# Patient Record
Sex: Female | Born: 1990 | Race: Black or African American | Hispanic: No | Marital: Married | Smoking: Never smoker
Health system: Southern US, Community
[De-identification: ages and names within clinical notes are randomized; demographics above are authoritative.]

## PROBLEM LIST (undated history)

## (undated) DIAGNOSIS — Z789 Other specified health status: Secondary | ICD-10-CM

---

## 2014-10-09 ENCOUNTER — Other Ambulatory Visit (HOSPITAL_COMMUNITY): Payer: Self-pay | Admitting: Obstetrics

## 2014-10-09 DIAGNOSIS — O30003 Twin pregnancy, unspecified number of placenta and unspecified number of amniotic sacs, third trimester: Secondary | ICD-10-CM

## 2014-10-09 DIAGNOSIS — Z0489 Encounter for examination and observation for other specified reasons: Secondary | ICD-10-CM

## 2014-10-09 DIAGNOSIS — IMO0002 Reserved for concepts with insufficient information to code with codable children: Secondary | ICD-10-CM

## 2014-10-23 ENCOUNTER — Encounter (HOSPITAL_COMMUNITY): Payer: Self-pay

## 2014-10-23 ENCOUNTER — Ambulatory Visit (HOSPITAL_COMMUNITY)
Admission: RE | Admit: 2014-10-23 | Discharge: 2014-10-23 | Disposition: A | Discharge status: Home / Self Care | Payer: Medicaid Other | Source: Ambulatory Visit | Attending: Obstetrics | Admitting: Obstetrics

## 2014-10-23 ENCOUNTER — Other Ambulatory Visit (HOSPITAL_COMMUNITY): Payer: Self-pay | Admitting: Obstetrics

## 2014-10-23 DIAGNOSIS — O30003 Twin pregnancy, unspecified number of placenta and unspecified number of amniotic sacs, third trimester: Secondary | ICD-10-CM | POA: Diagnosis not present

## 2014-10-23 DIAGNOSIS — Z0489 Encounter for examination and observation for other specified reasons: Secondary | ICD-10-CM

## 2014-10-23 DIAGNOSIS — IMO0002 Reserved for concepts with insufficient information to code with codable children: Secondary | ICD-10-CM

## 2014-10-23 DIAGNOSIS — Z3689 Encounter for other specified antenatal screening: Secondary | ICD-10-CM | POA: Insufficient documentation

## 2014-10-23 DIAGNOSIS — O30043 Twin pregnancy, dichorionic/diamniotic, third trimester: Secondary | ICD-10-CM | POA: Insufficient documentation

## 2014-10-23 DIAGNOSIS — Z3A28 28 weeks gestation of pregnancy: Secondary | ICD-10-CM | POA: Insufficient documentation

## 2014-11-20 ENCOUNTER — Ambulatory Visit (HOSPITAL_COMMUNITY)
Admission: RE | Admit: 2014-11-20 | Discharge: 2014-11-20 | Disposition: A | Discharge status: Home / Self Care | Payer: Medicaid Other | Source: Ambulatory Visit | Attending: Obstetrics | Admitting: Obstetrics

## 2014-11-20 ENCOUNTER — Other Ambulatory Visit (HOSPITAL_COMMUNITY): Payer: Self-pay | Admitting: Obstetrics

## 2014-11-20 ENCOUNTER — Encounter (HOSPITAL_COMMUNITY): Payer: Self-pay

## 2014-11-20 DIAGNOSIS — Z36 Encounter for antenatal screening of mother: Secondary | ICD-10-CM | POA: Diagnosis not present

## 2014-11-20 DIAGNOSIS — O30003 Twin pregnancy, unspecified number of placenta and unspecified number of amniotic sacs, third trimester: Secondary | ICD-10-CM | POA: Diagnosis not present

## 2014-11-20 DIAGNOSIS — Z3A32 32 weeks gestation of pregnancy: Secondary | ICD-10-CM | POA: Insufficient documentation

## 2014-12-06 ENCOUNTER — Inpatient Hospital Stay (HOSPITAL_COMMUNITY)
Admission: AD | Admit: 2014-12-06 | Discharge: 2014-12-06 | Disposition: A | Discharge status: Other Acute Inpatient Hospital | Payer: Medicaid Other | Source: Ambulatory Visit | Attending: Obstetrics | Admitting: Obstetrics

## 2014-12-06 ENCOUNTER — Inpatient Hospital Stay (HOSPITAL_COMMUNITY): Payer: Medicaid Other

## 2014-12-06 ENCOUNTER — Encounter (HOSPITAL_COMMUNITY): Payer: Self-pay | Admitting: *Deleted

## 2014-12-06 DIAGNOSIS — O42913 Preterm premature rupture of membranes, unspecified as to length of time between rupture and onset of labor, third trimester: Secondary | ICD-10-CM | POA: Insufficient documentation

## 2014-12-06 DIAGNOSIS — O30003 Twin pregnancy, unspecified number of placenta and unspecified number of amniotic sacs, third trimester: Secondary | ICD-10-CM | POA: Diagnosis not present

## 2014-12-06 DIAGNOSIS — O42919 Preterm premature rupture of membranes, unspecified as to length of time between rupture and onset of labor, unspecified trimester: Secondary | ICD-10-CM

## 2014-12-06 DIAGNOSIS — Z3A35 35 weeks gestation of pregnancy: Secondary | ICD-10-CM | POA: Insufficient documentation

## 2014-12-06 HISTORY — DX: Other specified health status: Z78.9

## 2014-12-06 LAB — URINE MICROSCOPIC-ADD ON

## 2014-12-06 LAB — AMNISURE RUPTURE OF MEMBRANE (ROM) NOT AT ARMC: AMNISURE: POSITIVE

## 2014-12-06 LAB — URINALYSIS, ROUTINE W REFLEX MICROSCOPIC
Bilirubin Urine: NEGATIVE
GLUCOSE, UA: NEGATIVE mg/dL
KETONES UR: NEGATIVE mg/dL
Leukocytes, UA: NEGATIVE
NITRITE: NEGATIVE
PH: 6 (ref 5.0–8.0)
Protein, ur: NEGATIVE mg/dL
Specific Gravity, Urine: <1.005 — ABNORMAL LOW (ref 1.005–1.030)
Urobilinogen, UA: 0.2 mg/dL (ref 0.0–1.0)

## 2014-12-06 MED ORDER — LACTATED RINGERS IV SOLN
INTRAVENOUS | Status: DC
  Administered 2014-12-06: 17:00:00 via INTRAVENOUS

## 2014-12-06 NOTE — MAU Note (Signed)
Twins. UC's since middle of night, continuing to get worse. No bleeding or leaking. Previous C/S, plans TOLAC. States babies vtx

## 2014-12-06 NOTE — MAU Note (Signed)
Pains started last night, have gotten stronger and closer. No bleeding, feeling wet

## 2014-12-06 NOTE — Progress Notes (Signed)
Transferred to [place] via [facility] at 1711. Patient stable.

## 2014-12-06 NOTE — MAU Provider Note (Signed)
History     CSN: [id number]  Arrival date and time: 12/06/14 1305   First Provider Initiated Contact with Patient 12/06/14 1410      Chief Complaint  Patient presents with  . Labor Eval   HPI Linda Reed 24 y.o. G2P1001  with twins presents complaining of contractions that have been ongoing for 12 hours.  She is not able to tell the timing.  She notes a small amt of leaking fluid.  Denies vaginal bleeding.  She does have dysuria.  Some nausea and vomiting based on dietary intake.   OB History    Gravida Para Term Preterm AB TAB SAB Ectopic Multiple Living   Past Medical History  Diagnosis Date  . Medical history non-contributory     Past Surgical History  Procedure Laterality Date  . Cesarean section      History reviewed. No pertinent family history.  Social History  Substance Use Topics  . Smoking status: Never Smoker   . Smokeless tobacco: Never Used  . Alcohol Use: No    Allergies:  Allergies  Allergen Reactions  . Depo-Provera [Medroxyprogesterone Acetate]     Prescriptions prior to admission  Medication Sig Dispense Refill Last Dose  . Prenatal Vit-Fe Fumarate-FA (PRENATAL MULTIVITAMIN) TABS tablet Take 1 tablet by mouth daily at 12 noon.       ROS Pertinent ROS in HPI.  All other systems are negative.   Physical Exam   Blood pressure 141/82, pulse 70, temperature 98.4 F (36.9 C), temperature source Oral, resp. rate 18, last menstrual period 04/04/2014.  Physical Exam  Constitutional: She is oriented to person, place, and time. She appears well-developed and well-nourished. No distress.  HENT:  Head: Normocephalic and atraumatic.  Eyes: EOM are normal.  Neck: Normal range of motion.  Cardiovascular: Normal rate.   Respiratory: Effort normal. No respiratory distress.  GI: Soft.  Musculoskeletal: Normal range of motion.  Neurological: She is alert and oriented to person, place, and time.  Skin: Skin is warm and dry.   Psychiatric: She has a normal mood and affect.   Fetal Tracing: Baseline: 140 for twin A, 120s for twin B Variability:mod for both A and B Accelerations: 15x15 for both A and B Decelerations:none Toco: irreg q 2-4  Cervical exam completed by nurse - 0.5cm dilation initially   MAU Course  Procedures  MDM Due to report of "feeling wet" amnisure to be collected.  Pt up to urinate.  No improvement in ctx. PO hydration.  No improvement in ctx.  Results for orders placed or performed during the hospital encounter of 12/06/14 (from the past 24 hour(s))  Urinalysis, Routine w reflex microscopic (not at [facility])     Status: Abnormal   Collection Time: 12/06/14  2:23 PM  Result Value Ref Range   Color, Urine YELLOW YELLOW   APPearance CLEAR CLEAR   Specific Gravity, Urine <1.005 (L) 1.005 - 1.030   pH 6.0 5.0 - 8.0   Glucose, UA NEGATIVE NEGATIVE mg/dL   Hgb urine dipstick SMALL (A) NEGATIVE   Bilirubin Urine NEGATIVE NEGATIVE   Ketones, ur NEGATIVE NEGATIVE mg/dL   Protein, ur NEGATIVE NEGATIVE mg/dL   Urobilinogen, UA 0.2 0.0 - 1.0 mg/dL   Nitrite NEGATIVE NEGATIVE   Leukocytes, UA NEGATIVE NEGATIVE  Urine microscopic-add on     Status: Abnormal   Collection Time: 12/06/14  2:23 PM  Result Value Ref Range  Squamous Epithelial / LPF RARE RARE   WBC, UA 3-6 <3 WBC/hpf   RBC / HPF 0-2 <3 RBC/hpf   Bacteria, UA FEW (A) RARE  Amnisure rupture of membrane (rom)not at [facility]     Status: None   Collection Time: 12/06/14  2:50 PM  Result Value Ref Range   Amnisure ROM POSITIVE    Positive ROM: Pt to be admitted   Assessment and Plan  A: Positive ROM  P: Nurse to report to MD for admitting orders  Bertram Denver 12/06/2014, 2:10 PM

## 2014-12-18 ENCOUNTER — Ambulatory Visit (HOSPITAL_COMMUNITY): Admission: RE | Admit: 2014-12-18 | Payer: Medicaid Other | Source: Ambulatory Visit

## 2014-12-27 ENCOUNTER — Encounter (HOSPITAL_COMMUNITY): Payer: Self-pay | Admitting: Physician Assistant

## 2015-01-27 LAB — PROCEDURE REPORT - SCANNED: Pap: NEGATIVE

## 2015-08-28 ENCOUNTER — Encounter (HOSPITAL_COMMUNITY): Payer: Self-pay | Admitting: *Deleted
